# Patient Record
Sex: Male | Born: 1970 | Race: White | Hispanic: No | Marital: Married | Smoking: Never smoker
Health system: Southern US, Community
[De-identification: ages and names within clinical notes are randomized; demographics above are authoritative.]

## PROBLEM LIST (undated history)

## (undated) HISTORY — PX: LEG SURGERY: SHX1003

---

## 2019-07-07 ENCOUNTER — Other Ambulatory Visit: Payer: Self-pay

## 2019-07-07 ENCOUNTER — Ambulatory Visit
Admission: EM | Admit: 2019-07-07 | Discharge: 2019-07-07 | Disposition: A | Discharge status: Home / Self Care | Payer: BC Managed Care – PPO | Attending: Emergency Medicine | Admitting: Emergency Medicine

## 2019-07-07 DIAGNOSIS — T23122A Burn of first degree of single left finger (nail) except thumb, initial encounter: Secondary | ICD-10-CM | POA: Diagnosis not present

## 2019-07-07 MED ORDER — CEPHALEXIN 250 MG PO CAPS: 250.0000 mg | ORAL_CAPSULE | Freq: Four times a day (QID) | ORAL | 0 refills | Status: DC

## 2019-07-07 NOTE — Discharge Instructions (Signed)
Advised patient to take antibiotic as prescribed and to completion Take OTC Tylenol ibuprofen for pain management Follow-up with primary care To return for worsening of symptoms

## 2019-07-07 NOTE — ED Triage Notes (Signed)
Patient has burn to finger from hot metal last week. burn to the left index finger. Fluid drained from the finger last night. Some throbbing and numbness to finger. Tip of left index finger is slightly bluish in color

## 2019-07-07 NOTE — ED Provider Notes (Signed)
[facility]    CSN: [id number] Arrival date & time: 07/07/19  1653      History   Chief Complaint Chief Complaint  Patient presents with  . Finger Injury  . Burn    HPI Alvin Craig is a 49 y.o. male.   Alvin Craig 49 years old male presented to the urgent care with a complaint of burn to the left index finger for the past week.  Reported drainage last night.  Report a precipitating event, or specific injury.  Reports his left finger index was burned while in contact with a hot metal at work.  Developed redness and soreness couple days after.  Patient localizes pain to the left index finger.  Describes it as constant and achy in character.  Has tried OTC medications without relief.  Denies similar symptoms in the past.  Denies fever, chills, appetite change, weight change, chest pain, nausea, vomiting, changes in bowel or bladder habits.  The history is provided by the patient. No language interpreter was used.  Burn   History reviewed. No pertinent past medical history.  There are no problems to display for this patient.   History reviewed. No pertinent surgical history.     Home Medications    Prior to Admission medications   Medication Sig Start Date End Date Taking? Authorizing Provider  cephALEXin (KEFLEX) 250 MG capsule Take 1 capsule (250 mg total) by mouth 4 (four) times daily. 07/07/19   AvegnoDarrelyn Hillock, FNP    Family History Family History  Problem Relation Age of Onset  . Healthy Mother   . Healthy Father     Social History Social History   Tobacco Use  . Smoking status: Never Smoker  . Smokeless tobacco: Never Used  Substance Use Topics  . Alcohol use: Yes    Comment: 6 beers one day a week  . Drug use: Not on file     Allergies   Patient has no known allergies.   Review of Systems Review of Systems  Constitutional: Negative.   Respiratory: Negative.   Cardiovascular: Negative.   Skin: Positive for color change.  All  other systems reviewed and are negative.    Physical Exam Triage Vital Signs ED Triage Vitals  Enc Vitals Group     BP 07/07/19 1703 (!) 147/85     Pulse Rate 07/07/19 1703 85     Resp 07/07/19 1703 17     Temp 07/07/19 1703 98.3 F (36.8 C)     Temp Source 07/07/19 1703 Oral     SpO2 07/07/19 1703 97 %     Weight --      Height --      Head Circumference --      Peak Flow --      Pain Score 07/07/19 1709 3     Pain Loc --      Pain Edu? --      Excl. in [place]? --    No data found.  Updated Vital Signs BP (!) 147/85 (BP Location: Right Arm)   Pulse 85   Temp 98.3 F (36.8 C) (Oral)   Resp 17   SpO2 97%   Visual Acuity Right Eye Distance:   Left Eye Distance:   Bilateral Distance:    Right Eye Near:   Left Eye Near:    Bilateral Near:     Physical Exam Vitals and nursing note reviewed.  Constitutional:      General: He is not in acute  distress.    Appearance: Normal appearance. He is normal weight. He is not ill-appearing or toxic-appearing.  Cardiovascular:     Rate and Rhythm: Normal rate and regular rhythm.     Pulses: Normal pulses.     Heart sounds: Normal heart sounds. No murmur.  Pulmonary:     Effort: Pulmonary effort is normal. No respiratory distress.     Breath sounds: Normal breath sounds. No wheezing.  Chest:     Chest wall: No tenderness.  Skin:    General: Skin is warm.     Capillary Refill: Capillary refill takes less than 2 seconds.     Coloration: Skin is not jaundiced or pale.     Findings: Erythema present.     Comments: Skin burn  Neurological:     General: No focal deficit present.     Mental Status: He is alert and oriented to person, place, and time.     Sensory: No sensory deficit.   07/07/2019     UC Treatments / Results  Labs (all labs ordered are listed, but only abnormal results are displayed) Labs Reviewed - No data to display  EKG   Radiology No results found.  Procedures Procedures (including critical  care time)  Medications Ordered in UC Medications - No data to display  Initial Impression / Assessment and Plan / UC Course  I have reviewed the triage vital signs and the nursing notes.  Pertinent labs & imaging results that were available during my care of the patient were reviewed by me and considered in my medical decision making (see chart for details).    Will treat patient for possible infection of the left index finger Keflex was prescribed To return for worsening of symptoms  Final Clinical Impressions(s) / UC Diagnoses   Final diagnoses:  Superficial burn of index finger of left hand     Discharge Instructions     Advised patient to take antibiotic as prescribed and to completion Take OTC Tylenol ibuprofen for pain management Follow-up with primary care To return for worsening of symptoms    ED Prescriptions    Medication Sig Dispense Auth. Provider   cephALEXin (KEFLEX) 250 MG capsule Take 1 capsule (250 mg total) by mouth 4 (four) times daily. 28 capsule Lamona Eimer, Zachery Dakins, FNP     PDMP not reviewed this encounter.   Durward Parcel, FNP 07/07/19 1744

## 2019-07-08 ENCOUNTER — Encounter (HOSPITAL_COMMUNITY): Payer: Self-pay

## 2019-07-08 ENCOUNTER — Other Ambulatory Visit: Payer: Self-pay

## 2019-07-08 ENCOUNTER — Emergency Department (HOSPITAL_COMMUNITY)
Admission: EM | Admit: 2019-07-08 | Discharge: 2019-07-08 | Disposition: A | Discharge status: Home / Self Care | Payer: BC Managed Care – PPO | Attending: Emergency Medicine | Admitting: Emergency Medicine

## 2019-07-08 ENCOUNTER — Ambulatory Visit
Admission: EM | Admit: 2019-07-08 | Discharge: 2019-07-08 | Disposition: A | Discharge status: Home / Self Care | Payer: BC Managed Care – PPO

## 2019-07-08 DIAGNOSIS — Z23 Encounter for immunization: Secondary | ICD-10-CM | POA: Diagnosis not present

## 2019-07-08 DIAGNOSIS — L02512 Cutaneous abscess of left hand: Secondary | ICD-10-CM

## 2019-07-08 DIAGNOSIS — M79644 Pain in right finger(s): Secondary | ICD-10-CM | POA: Diagnosis present

## 2019-07-08 MED ORDER — LIDOCAINE HCL (PF) 2 % IJ SOLN
10.0000 mL | Freq: Once | INTRAMUSCULAR | Status: AC
  Administered 2019-07-08: 22:00:00 10 mL via INTRADERMAL

## 2019-07-08 MED ORDER — POVIDONE-IODINE 10 % EX SOLN
CUTANEOUS | Status: DC | PRN
  Administered 2019-07-08: 1 via TOPICAL
  Filled 2019-07-08: qty 15

## 2019-07-08 MED ORDER — LIDOCAINE HCL (PF) 2 % IJ SOLN
INTRAMUSCULAR | Status: AC
  Filled 2019-07-08: qty 20

## 2019-07-08 MED ORDER — LIDOCAINE HCL (PF) 2 % IJ SOLN: 5.0000 mL | Freq: Once | INTRAMUSCULAR | Status: DC

## 2019-07-08 MED ORDER — TETANUS-DIPHTH-ACELL PERTUSSIS 5-2.5-18.5 LF-MCG/0.5 IM SUSP
0.5000 mL | Freq: Once | INTRAMUSCULAR | Status: AC
  Administered 2019-07-08: 0.5 mL via INTRAMUSCULAR
  Filled 2019-07-08: qty 0.5

## 2019-07-08 NOTE — ED Provider Notes (Signed)
[facility] EMERGENCY DEPARTMENT Provider Note   CSN: [id number] Arrival date & time: 07/08/19  1831     History Chief Complaint  Patient presents with  . Finger Injury    Alvin Craig is a 49 y.o. male.  HPI      Alvin Craig is a 49 y.o. male who presents to the Emergency Department complaining of pain, discoloration, and swelling of his left index finger.  He states that he was using a heat gun 4 days ago when he accidentally burned the tip of his index finger while attempting to melt tar.  He was wearing work gloves at the time.  He reports noticing a small white appearing blister to the fat pad of the index finger.  The area later turned bluish to black in color and he reports increasing swelling and pain of his finger.  He was seen at a local urgent care yesterday and treated with cephalexin.  Today, he reports his finger has increased in size down to the level of his knuckle and the area of discoloration of the fat pad has increased.  He reports pain with movement of the finger at the distal joint.  He denies numbness of the finger, open wound or attempted drainage of the area.  No fever no chills.  Last Td is unknown   History reviewed. No pertinent past medical history.  There are no problems to display for this patient.   History reviewed. No pertinent surgical history.     Family History  Problem Relation Age of Onset  . Healthy Mother   . Healthy Father     Social History   Tobacco Use  . Smoking status: Never Smoker  . Smokeless tobacco: Never Used  Substance Use Topics  . Alcohol use: Yes    Comment: 6 beers one day a week  . Drug use: Not on file    Home Medications Prior to Admission medications   Medication Sig Start Date End Date Taking? Authorizing Provider  cephALEXin (KEFLEX) 250 MG capsule Take 1 capsule (250 mg total) by mouth 4 (four) times daily. 07/07/19   Durward Parcel, FNP    Allergies    Patient has no known  allergies.  Review of Systems   Review of Systems  Constitutional: Negative for chills and fever.  Musculoskeletal: Positive for arthralgias. Negative for joint swelling.  Skin: Positive for color change. Negative for wound.       Burn to the distal left index finger  Neurological: Negative for numbness.    Physical Exam Updated Vital Signs BP (!) 161/82 (BP Location: Right Arm)   Pulse 96   Temp 98.8 F (37.1 C) (Oral)   Resp 18   SpO2 98%   Physical Exam Vitals and nursing note reviewed.  Constitutional:      General: He is not in acute distress.    Appearance: Normal appearance. He is well-developed. He is not toxic-appearing.  HENT:     Head: Normocephalic.  Cardiovascular:     Rate and Rhythm: Normal rate and regular rhythm.     Pulses: Normal pulses.  Pulmonary:     Effort: Pulmonary effort is normal. No respiratory distress.     Breath sounds: Normal breath sounds.  Musculoskeletal:        General: Swelling, tenderness and signs of injury present.     Comments: Patient with edema and purple discoloration of the palmar surface to the distal end of the left index finger.  Palmar surface  of the finger is fluctuant.  Nail intact.  Edema extends to the PIP joint.  Wrist is nontender.  Patient is able to flex at the PIP joint but unable to flex at the DIP joint due to edema.  Sensation intact.  See photos  Skin:    General: Skin is warm.     Capillary Refill: Capillary refill takes less than 2 seconds.     Findings: No laceration.  Neurological:     Mental Status: He is alert and oriented to person, place, and time.     Sensory: No sensory deficit.     Motor: No weakness or abnormal muscle tone.            ED Results / Procedures / Treatments   Labs (all labs ordered are listed, but only abnormal results are displayed) Labs Reviewed - No data to display  EKG None  Radiology No results found.  Procedures .Marland KitchenIncision and Drainage  Date/Time: 07/08/2019  10:50 PM Performed by: Kem Parkinson, PA-C Authorized by: Kem Parkinson, PA-C   Consent:    Consent obtained:  Verbal   Consent given by:  Patient   Risks discussed:  Bleeding and incomplete drainage Location:    Type:  Abscess (burn to the finger)   Location:  Upper extremity   Upper extremity location:  Finger   Finger location:  L index finger Pre-procedure details:    Skin preparation:  Betadine Anesthesia (see MAR for exact dosages):    Anesthesia method:  Nerve block   Block location:  Left index finger   Block needle gauge:  25 G   Block anesthetic:  Lidocaine 2% w/o epi   Block technique:  Digital block   Block injection procedure:  Anatomic landmarks palpated   Block outcome:  Anesthesia achieved Procedure type:    Complexity:  Simple Procedure details:    Needle aspiration: no     Incision types:  Stab incision   Incision depth:  Dermal   Scalpel blade:  11   Wound management:  Irrigated with saline   Drainage:  Bloody and purulent   Drainage amount:  Copious   Wound treatment:  Wound left open   Packing materials:  None Post-procedure details:    Patient tolerance of procedure:  Tolerated well, no immediate complications Comments:     Abscess culture obtained   (including critical care time)    Medications Ordered in ED Medications - No data to display  ED Course  I have reviewed the triage vital signs and the nursing notes.  Pertinent labs & imaging results that were available during my care of the patient were reviewed by me and considered in my medical decision making (see chart for details).    MDM Rules/Calculators/A&P                     67 days old burn to the distal left index finger with likely abscess.   NV intact. Pt well appearing. No lymphangitis.   Consulted hand, Dr. Lenon Curt and discussed findings.  He recommends I&D, warm soaks and 24-48 hr recheck.    Successful I&D with copious bloody to purulent drainage.  Pain improved.   Thoroughly irrigated with saline.  Pt agrees to continue his abx, abscess cultured, td updated    Final Clinical Impression(s) / ED Diagnoses Final diagnoses:  Abscess of left index finger    Rx / DC Orders ED Discharge Orders    None  Pauline Aus, PA-C 07/09/19 Mike Gip    Bethann Berkshire, MD 07/11/19 [phone]

## 2019-07-08 NOTE — ED Triage Notes (Signed)
Pt presents to ED with complaints of burn to left index finger. Pt was seen in UC yesterday and given abx. Pt states swelling is getting bigger and UC sent to ED today.

## 2019-07-08 NOTE — ED Notes (Signed)
ED Provider at bedside. 

## 2019-07-08 NOTE — Discharge Instructions (Addendum)
Elevate your hand as much as possible.  Soak your finger in warm water mixed with peroxide.  Keep the finger bandaged.  Return here in 24 hours for recheck.  Continue taking your antibiotics as directed.  Over-the-counter ibuprofen 600 mg 3 times a day with food as needed for pain.

## 2019-07-10 ENCOUNTER — Encounter (HOSPITAL_COMMUNITY): Payer: Self-pay | Admitting: *Deleted

## 2019-07-10 ENCOUNTER — Other Ambulatory Visit: Payer: Self-pay

## 2019-07-10 ENCOUNTER — Emergency Department (HOSPITAL_COMMUNITY)
Admission: EM | Admit: 2019-07-10 | Discharge: 2019-07-10 | Disposition: A | Discharge status: Home / Self Care | Payer: BC Managed Care – PPO | Attending: Emergency Medicine | Admitting: Emergency Medicine

## 2019-07-10 DIAGNOSIS — L02512 Cutaneous abscess of left hand: Secondary | ICD-10-CM | POA: Diagnosis not present

## 2019-07-10 DIAGNOSIS — Z4800 Encounter for change or removal of nonsurgical wound dressing: Secondary | ICD-10-CM | POA: Diagnosis present

## 2019-07-10 DIAGNOSIS — Z5189 Encounter for other specified aftercare: Secondary | ICD-10-CM | POA: Diagnosis not present

## 2019-07-10 MED ORDER — SULFAMETHOXAZOLE-TRIMETHOPRIM 800-160 MG PO TABS: 1.0000 | ORAL_TABLET | Freq: Two times a day (BID) | ORAL | 0 refills | Status: AC

## 2019-07-10 NOTE — ED Notes (Signed)
Here for recheck for index finger burn last week   Wound is healing and is assessed and dressed

## 2019-07-10 NOTE — Discharge Instructions (Addendum)
Continue to warm water soaks to finger.  Keep it bandaged while working.  Avoid excessive use of your left index finger.  Contact the hand surgeon listed to arrange a follow-up appointment next week if your symptoms are not continuing to improve.

## 2019-07-10 NOTE — ED Provider Notes (Signed)
[facility] EMERGENCY DEPARTMENT Provider Note   CSN: [id number] Arrival date & time: 07/10/19  1605     History Chief Complaint  Patient presents with  . Wound Check    Alvin Craig is a 49 y.o. male.  HPI      Alvin Craig is a 49 y.o. male who presents to the Emergency Department requesting reevaluation of his left index finger.  He was initially seen at a local urgent care and came to ER after he developed increasing pain and swelling to the finger.  He was seen here by me on 07/08/19 and found to have discoloration and swelling of his distal left index finger secondary to a heat burn.  I performed an I&D with a significant amount of bloody, purulent material drained from the finger.  Patient is here today at my request for recheck.  He is taking keflex 4 times per day.  He reports swelling and pain of the finger is improving and he is now able to bend his finger more easily.  He denies numbness, increasing pain or redness, fever or chills.     History reviewed. No pertinent past medical history.  There are no problems to display for this patient.   History reviewed. No pertinent surgical history.     Family History  Problem Relation Age of Onset  . Healthy Mother   . Healthy Father     Social History   Tobacco Use  . Smoking status: Never Smoker  . Smokeless tobacco: Never Used  Substance Use Topics  . Alcohol use: Yes    Comment: 6 beers one day a week  . Drug use: Not on file    Home Medications Prior to Admission medications   Medication Sig Start Date End Date Taking? Authorizing Provider  cephALEXin (KEFLEX) 250 MG capsule Take 1 capsule (250 mg total) by mouth 4 (four) times daily. 07/07/19   Avegno, Darrelyn Hillock, FNP  ibuprofen (ADVIL) 200 MG tablet Take 200 mg by mouth every 6 (six) hours as needed for mild pain or moderate pain.    [provider]  sulfamethoxazole-trimethoprim (BACTRIM DS) 800-160 MG tablet Take 1 tablet by mouth 2 (two) times  daily for 7 days. 07/10/19 07/17/19  Ikenna Ohms, Lynelle Smoke, PA-C    Allergies    Patient has no known allergies.  Review of Systems   Review of Systems  Constitutional: Negative for chills and fever.  Gastrointestinal: Negative for nausea and vomiting.  Musculoskeletal:       Previous I&D of the left index finger.    Skin: Negative for color change.  Hematological: Negative for adenopathy.    Physical Exam Updated Vital Signs BP 133/72 (BP Location: Right Arm)   Pulse 85   Temp 97.8 F (36.6 C) (Temporal)   Resp 12   Ht 5\' 8"  (1.727 m)   Wt 97.5 kg   SpO2 100%   BMI 32.69 kg/m   Physical Exam Vitals and nursing note reviewed.  Constitutional:      General: He is not in acute distress.    Appearance: Normal appearance.  Cardiovascular:     Rate and Rhythm: Normal rate and regular rhythm.     Pulses: Normal pulses.  Pulmonary:     Effort: Pulmonary effort is normal.     Breath sounds: Normal breath sounds.  Musculoskeletal:     Comments: Previous I&D of the palmar surface of the distal left index finger.  Small amt of serous drainage noted.  Erythema improved  from previous visit.  Pt able to flex at DIP and PIP joints.    Skin:    General: Skin is warm.     Capillary Refill: Capillary refill takes less than 2 seconds.     Findings: No rash.  Neurological:     Mental Status: He is alert.     ED Results / Procedures / Treatments   Labs (all labs ordered are listed, but only abnormal results are displayed) Labs Reviewed - No data to display  EKG None  Radiology No results found.  Procedures Procedures (including critical care time)  Medications Ordered in ED Medications - No data to display  ED Course  I have reviewed the triage vital signs and the nursing notes.  Pertinent labs & imaging results that were available during my care of the patient were reviewed by me and considered in my medical decision making (see chart for details).    MDM  Rules/Calculators/A&P                      Patient seen here by me 2 days ago and had I&D of the distal finger.  Area appears to be improving.  Erythema is much improved from previous visit and patient has improved movement of the PIP and DIP joints of the finger.  Sensation intact.  He will continue his Keflex and I have also prescribed Bactrim.  He will continue warm water soaks and he was given referral information for hand specialty.   Final Clinical Impression(s) / ED Diagnoses Final diagnoses:  Wound check, abscess    Rx / DC Orders ED Discharge Orders         Ordered    sulfamethoxazole-trimethoprim (BACTRIM DS) 800-160 MG tablet  2 times daily     07/10/19 1653           Pauline Aus, PA-C 07/10/19 1802    Cathren Laine, MD 07/10/19 1842

## 2019-07-10 NOTE — ED Triage Notes (Signed)
Pt instructed to be seen again for wound to left index finger.  Pt states it is better and not getting worse. Pt states he is taking antibiotics as directed.

## 2019-07-11 LAB — AEROBIC CULTURE W GRAM STAIN (SUPERFICIAL SPECIMEN): Gram Stain: NONE SEEN

## 2019-07-12 ENCOUNTER — Telehealth: Payer: Self-pay

## 2019-07-12 NOTE — Telephone Encounter (Signed)
Post ED Visit - Positive Culture Follow-up  Culture report reviewed by antimicrobial stewardship pharmacist: [facility] []  , Pharm.D. []  Enzo Bi, Pharm.D., BCPS AQ-ID []  , Pharm.D., BCPS []  Celedonio Miyamoto, Pharm.D., BCPS []  [place], Garvin Fila.D., BCPS, AAHIVP []  , Pharm.D., BCPS, AAHIVP [x]  Georgina Pillion, PharmD, BCPS []  , PharmD, BCPS []  [place], PharmD, BCPS []  [address], PharmD []  , PharmD, BCPS []  Estella Husk, PharmD  Pharmacy Team []  Lysle Pearl, PharmD []  , PharmD []  Phillips Climes, PharmD []  , Rph []  Agapito Games) , PharmD []  Verlan Friends, PharmD []  , PharmD []  Mervyn Gay, PharmD []  , PharmD []  Vinnie Level, PharmD []  [facility], PharmD []  , PharmD []  Len Childs, PharmD   Positive aerobic culture Treated with Keflex, BactrimDS, organism sensitive to the same and no further patient follow-up is required at this time.  07/12/2019, 9:26 AM

## 2019-07-21 ENCOUNTER — Telehealth: Payer: Self-pay

## 2019-07-21 ENCOUNTER — Encounter: Payer: Self-pay | Admitting: *Deleted

## 2019-07-21 NOTE — Telephone Encounter (Signed)
Received a call from [facility]. A ref was sent over and Alvin Craig wanted to get pt scheduled. [phone]

## 2019-07-22 NOTE — Telephone Encounter (Signed)
Pt is scheduled a telephone NV for 10/04/2019 at 3pm with triage nurse and a letter was mailed to the patient yesterday.

## 2019-10-04 ENCOUNTER — Other Ambulatory Visit: Payer: Self-pay

## 2019-10-04 ENCOUNTER — Ambulatory Visit: Payer: Self-pay

## 2020-01-04 ENCOUNTER — Other Ambulatory Visit: Payer: Self-pay

## 2020-01-04 ENCOUNTER — Encounter: Payer: Self-pay | Admitting: Internal Medicine

## 2020-01-04 ENCOUNTER — Ambulatory Visit: Payer: Self-pay

## 2020-01-05 ENCOUNTER — Ambulatory Visit: Payer: BC Managed Care – PPO | Attending: Internal Medicine

## 2020-01-05 DIAGNOSIS — Z23 Encounter for immunization: Secondary | ICD-10-CM

## 2020-01-05 NOTE — Progress Notes (Signed)
   Covid-19 Vaccination Clinic  Name:  Alvin Craig    MRN: [id number] DOB: March 03, 1971  01/05/2020  Mr. Alvin Craig was observed post Covid-19 immunization for 15 minutes without incident. He was provided with Vaccine Information Sheet and instruction to access the V-Safe system.   Mr. Alvin Craig was instructed to call 911 with any severe reactions post vaccine: Marland Kitchen Difficulty breathing  . Swelling of face and throat  . A fast heartbeat  . A bad rash all over body  . Dizziness and weakness   Immunizations Administered    Name Date Dose VIS Date Route   Pfizer COVID-19 Vaccine 01/05/2020  9:57 AM 0.3 mL 08/10/2018 Intramuscular   Manufacturer: [employer], [employer]   Lot: [id number]   NDC: [address]-9

## 2020-01-26 ENCOUNTER — Ambulatory Visit: Payer: BC Managed Care – PPO | Attending: Internal Medicine

## 2020-01-26 DIAGNOSIS — Z23 Encounter for immunization: Secondary | ICD-10-CM

## 2020-01-26 NOTE — Progress Notes (Signed)
° °  Covid-19 Vaccination Clinic  Name:  Alvin Craig    MRN: [id number] DOB: Feb 28, 1971  01/26/2020  Mr. Alvin Craig was observed post Covid-19 immunization for 15 minutes without incident. He was provided with Vaccine Information Sheet and instruction to access the V-Safe system.   Mr. Alvin Craig was instructed to call 911 with any severe reactions post vaccine:  Difficulty breathing   Swelling of face and throat   A fast heartbeat   A bad rash all over body   Dizziness and weakness   Immunizations Administered    Name Date Dose VIS Date Route   Pfizer COVID-19 Vaccine 01/26/2020  9:53 AM 0.3 mL 08/10/2018 Intramuscular   Manufacturer: [employer], [employer]   Lot: [id number]   NDC: [address]-2

## 2020-08-29 ENCOUNTER — Encounter (HOSPITAL_COMMUNITY): Payer: Self-pay

## 2020-08-29 ENCOUNTER — Other Ambulatory Visit: Payer: Self-pay

## 2020-08-29 ENCOUNTER — Emergency Department (HOSPITAL_COMMUNITY): Payer: BC Managed Care – PPO

## 2020-08-29 ENCOUNTER — Emergency Department (HOSPITAL_COMMUNITY)
Admission: EM | Admit: 2020-08-29 | Discharge: 2020-08-29 | Disposition: A | Discharge status: Home / Self Care | Payer: BC Managed Care – PPO | Attending: Emergency Medicine | Admitting: Emergency Medicine

## 2020-08-29 DIAGNOSIS — F17220 Nicotine dependence, chewing tobacco, uncomplicated: Secondary | ICD-10-CM | POA: Insufficient documentation

## 2020-08-29 DIAGNOSIS — R0789 Other chest pain: Secondary | ICD-10-CM | POA: Insufficient documentation

## 2020-08-29 LAB — CBC
HCT: 43.6 % (ref 39.0–52.0)
Hemoglobin: 14.1 g/dL (ref 13.0–17.0)
MCH: 29.3 pg (ref 26.0–34.0)
MCHC: 32.3 g/dL (ref 30.0–36.0)
MCV: 90.5 fL (ref 80.0–100.0)
Platelets: 242 10*3/uL (ref 150–400)
RBC: 4.82 MIL/uL (ref 4.22–5.81)
RDW: 13.1 % (ref 11.5–15.5)
WBC: 6.6 10*3/uL (ref 4.0–10.5)
nRBC: 0 % (ref 0.0–0.2)

## 2020-08-29 LAB — BASIC METABOLIC PANEL
Anion gap: 10 (ref 5–15)
BUN: 20 mg/dL (ref 6–20)
CO2: 27 mmol/L (ref 22–32)
Calcium: 9.1 mg/dL (ref 8.9–10.3)
Chloride: 106 mmol/L (ref 98–111)
Creatinine, Ser: 0.8 mg/dL (ref 0.61–1.24)
GFR, Estimated: >60 mL/min (ref >60)
Glucose, Bld: 135 mg/dL — ABNORMAL HIGH (ref 70–99)
Potassium: 4 mmol/L (ref 3.5–5.1)
Sodium: 143 mmol/L (ref 135–145)

## 2020-08-29 LAB — TROPONIN I (HIGH SENSITIVITY): Troponin I (High Sensitivity): <2 ng/L (ref <18)

## 2020-08-29 NOTE — Discharge Instructions (Addendum)
I suspect your chest pain is secondary due to a muscular strain.  Will recommend over-the-counter pain medications like ibuprofen and/or Tylenol every 6 hours as needed please follow dosage on the back of bottle.  Like you to follow-up with your primary care provider for reevaluation.  Come back to the emergency department if you develop chest pain, shortness of breath, severe abdominal pain, uncontrolled nausea, vomiting, diarrhea.

## 2020-08-29 NOTE — ED Triage Notes (Signed)
Pt to er, pt states that he had a sudden onset of chest pain two days ago, states that it is a sporadic chest pain that seems to come and go, pt states that the pain wraps around his flank, denies shortness of breath.  States that he has some pressure when he takes a deep breath. Pt talking in full sentences.

## 2020-08-29 NOTE — ED Notes (Signed)
Pt ambulated in the hallway and oxygen saturations never fell below 97 percent.

## 2020-08-29 NOTE — ED Provider Notes (Signed)
[facility] EMERGENCY DEPARTMENT Provider Note   CSN: [id number] Arrival date & time: 08/29/20  1351     History Chief Complaint  Patient presents with  . Chest Pain    Alvin Craig is a 50 y.o. male.  HPI   Patient with no significant medical history presents to the emergency department with chief complaint of intermittent chest pain.  He endorses this started approximately 2 days ago, states he will have a sharp-like sensation on the left side of his chest will last a few seconds and go away on its own, denies exertion or breathing makes the pain worse, pain is not associated with shortness of breath, nausea, vomiting, lightheadedness or dizziness.  Patient denies smoking history, does not drink alcohol, denies illicit drug use, he has no cardiac history, no history of PEs or DVTs, currently not on hormone therapy.  Patient denies  alleviating or aggravating factors.  Patient denies headaches, fevers, chills, shortness of breath, abdominal pain, nausea, vomiting, diarrhea, worsening pedal edema.  History reviewed. No pertinent past medical history.  There are no problems to display for this patient.   Past Surgical History:  Procedure Laterality Date  . LEG SURGERY         Family History  Problem Relation Age of Onset  . Healthy Mother   . Healthy Father     Social History   Tobacco Use  . Smoking status: Never Smoker  . Smokeless tobacco: Current User    Types: Chew  Vaping Use  . Vaping Use: Never used  Substance Use Topics  . Alcohol use: Yes    Comment: 6 beers one day a week  . Drug use: Never    Home Medications Prior to Admission medications   Medication Sig Start Date End Date Taking? Authorizing Provider  cephALEXin (KEFLEX) 250 MG capsule Take 1 capsule (250 mg total) by mouth 4 (four) times daily. 07/07/19   Avegno, Zachery Dakins, FNP  ibuprofen (ADVIL) 200 MG tablet Take 200 mg by mouth every 6 (six) hours as needed for mild pain or moderate pain.     [provider]    Allergies    Patient has no known allergies.  Review of Systems   Review of Systems  Constitutional: Negative for chills and fever.  HENT: Negative for congestion.   Respiratory: Positive for chest tightness. Negative for shortness of breath.   Cardiovascular: Positive for chest pain.  Gastrointestinal: Negative for abdominal pain.  Genitourinary: Negative for enuresis.  Musculoskeletal: Negative for back pain.  Skin: Negative for rash.  Neurological: Negative for headaches.  Hematological: Does not bruise/bleed easily.    Physical Exam Updated Vital Signs BP (!) 146/79   Pulse 64   Temp 98 F (36.7 C) (Oral)   Resp 16   Ht 5\' 8"  (1.727 m)   Wt 90.7 kg   SpO2 100%   BMI 30.41 kg/m   Physical Exam Vitals and nursing note reviewed.  Constitutional:      General: He is not in acute distress.    Appearance: He is not ill-appearing.  HENT:     Head: Normocephalic and atraumatic.     Nose: No congestion.  Eyes:     Conjunctiva/sclera: Conjunctivae normal.  Cardiovascular:     Rate and Rhythm: Normal rate and regular rhythm.     Pulses: Normal pulses.     Heart sounds: No murmur heard. No friction rub. No gallop.      Comments: Patient currently has no chest  pain at this time, could not reproduce pain upon palpation. Pulmonary:     Effort: No respiratory distress.     Breath sounds: No wheezing, rhonchi or rales.  Abdominal:     Palpations: Abdomen is soft.     Tenderness: There is no abdominal tenderness. There is no right CVA tenderness or left CVA tenderness.  Musculoskeletal:     Right lower leg: No edema.     Left lower leg: No edema.  Skin:    General: Skin is warm and dry.  Neurological:     Mental Status: He is alert.  Psychiatric:        Mood and Affect: Mood normal.     ED Results / Procedures / Treatments   Labs (all labs ordered are listed, but only abnormal results are displayed) Labs Reviewed  BASIC METABOLIC  PANEL - Abnormal; Notable for the following components:      Result Value   Glucose, Bld 135 (*)    All other components within normal limits  CBC  TROPONIN I (HIGH SENSITIVITY)    EKG None  Radiology DG Chest 2 View  Result Date: 08/29/2020 CLINICAL DATA:  Chest pain. EXAM: CHEST - 2 VIEW COMPARISON:  None. FINDINGS: The heart size and mediastinal contours are within normal limits. Both lungs are clear. No pneumothorax or pleural effusion is noted. The visualized skeletal structures are unremarkable. IMPRESSION: No active cardiopulmonary disease. Electronically Signed   By: Lupita Raider M.D.   On: 08/29/2020 14:42    Procedures Procedures   Medications Ordered in ED Medications - No data to display  ED Course  I have reviewed the triage vital signs and the nursing notes.  Pertinent labs & imaging results that were available during my care of the patient were reviewed by me and considered in my medical decision making (see chart for details).    MDM Rules/Calculators/A&P                         Initial impression-patient presents with intermittent left-sided chest pain which is currently resolved at this time.  Patient is alert, does not appear in acute distress, vital signs reassuring.  Will obtain chest pain work-up and reassess.  Work-up-CBC unremarkable, BMP shows slight hyperglycemia 135, initial troponin is less than 2.  Chest x-ray negative for acute findings.  EKG sinus without signs of ischemia no ST elevation depression noted.  Patient is ambulated did not become hypoxic or tachycardic  Reassessment patient is reassessed, has no complaints at this time, vital signs remained stable.  Patient is agreeable for discharge at this time.  Rule out- I have low suspicion for ACS as history is atypical, patient has no cardiac history, EKG was sinus rhythm without signs of ischemia, initial troponin is less than 2, will defer second troponin as patient had chest pain for  greater than 12 hours, would expect elevation if ACS was present.  Low suspicion for PE as patient denies pleuritic chest pain, shortness of breath, patient denies leg pain, no pedal edema noted on exam, patient was PERC negative, no tachypnea or hypoxia on ambulation.  low suspicion for AAA or aortic dissection as history is atypical, patient has low risk factors.  Low suspicion for systemic infection as patient is nontoxic-appearing, vital signs reassuring, no obvious source infection noted on exam.   Plan-patient has chest pain with unknown etiology, I suspect this is musculoskeletal but differential diagnosis includes anxiety, costochondritis, cervical angina,  GERD, esophagus spasms.  Will recommend over-the-counter pain medications follow-up with PCP for further evaluation.  Vital signs have remained stable, no indication for hospital admission. Patient given at home care as well strict return precautions.  Patient verbalized that they understood agreed to said plan.   Final Clinical Impression(s) / ED Diagnoses Final diagnoses:  Atypical chest pain    Rx / DC Orders ED Discharge Orders    None       Carroll Sage, PA-C 08/29/20 1640    Vanetta Mulders, MD 09/05/20 [phone]

## 2021-11-11 IMAGING — DX DG CHEST 2V
2 series · 2 of 2 positions shown · non-contrast
Comparison: None.

CLINICAL DATA: Chest pain.

EXAM:
CHEST - 2 VIEW

[chest pa]
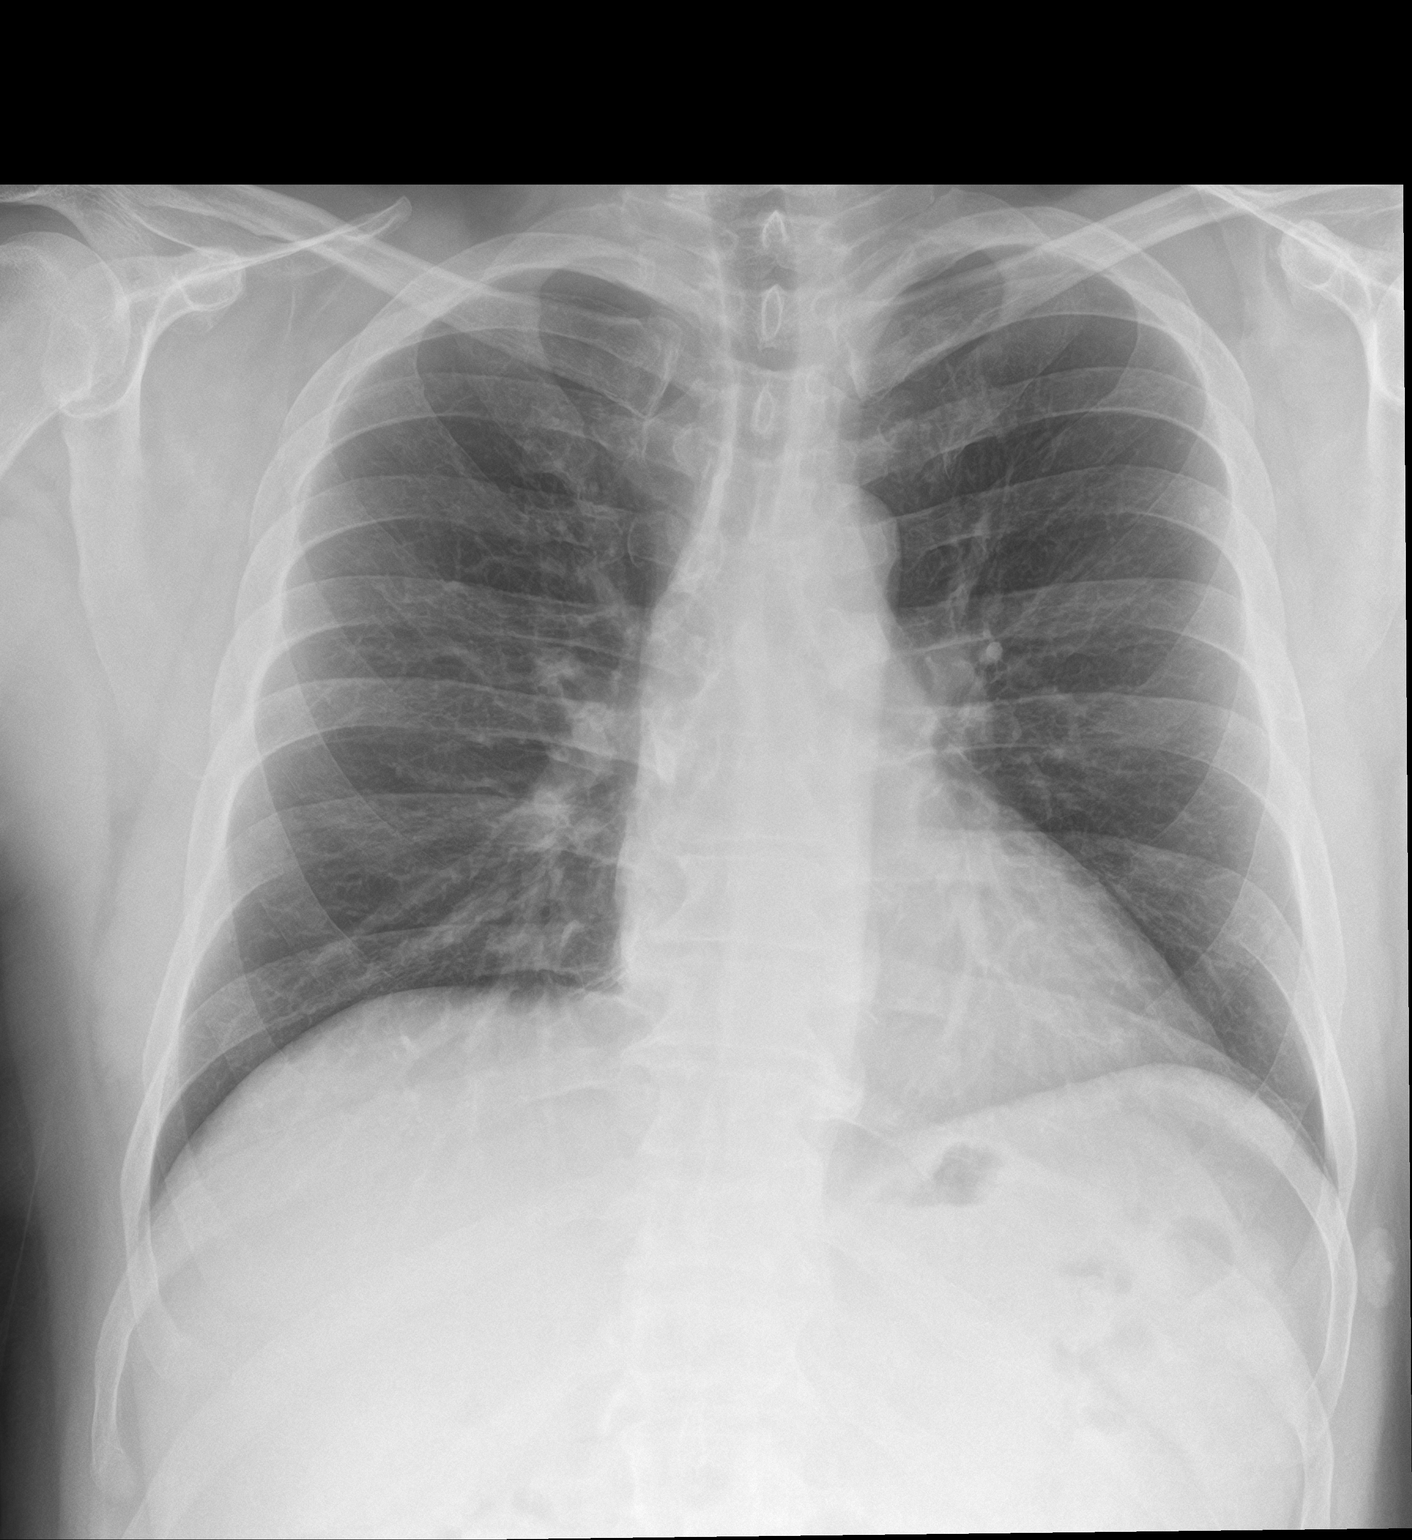

[chest lat]
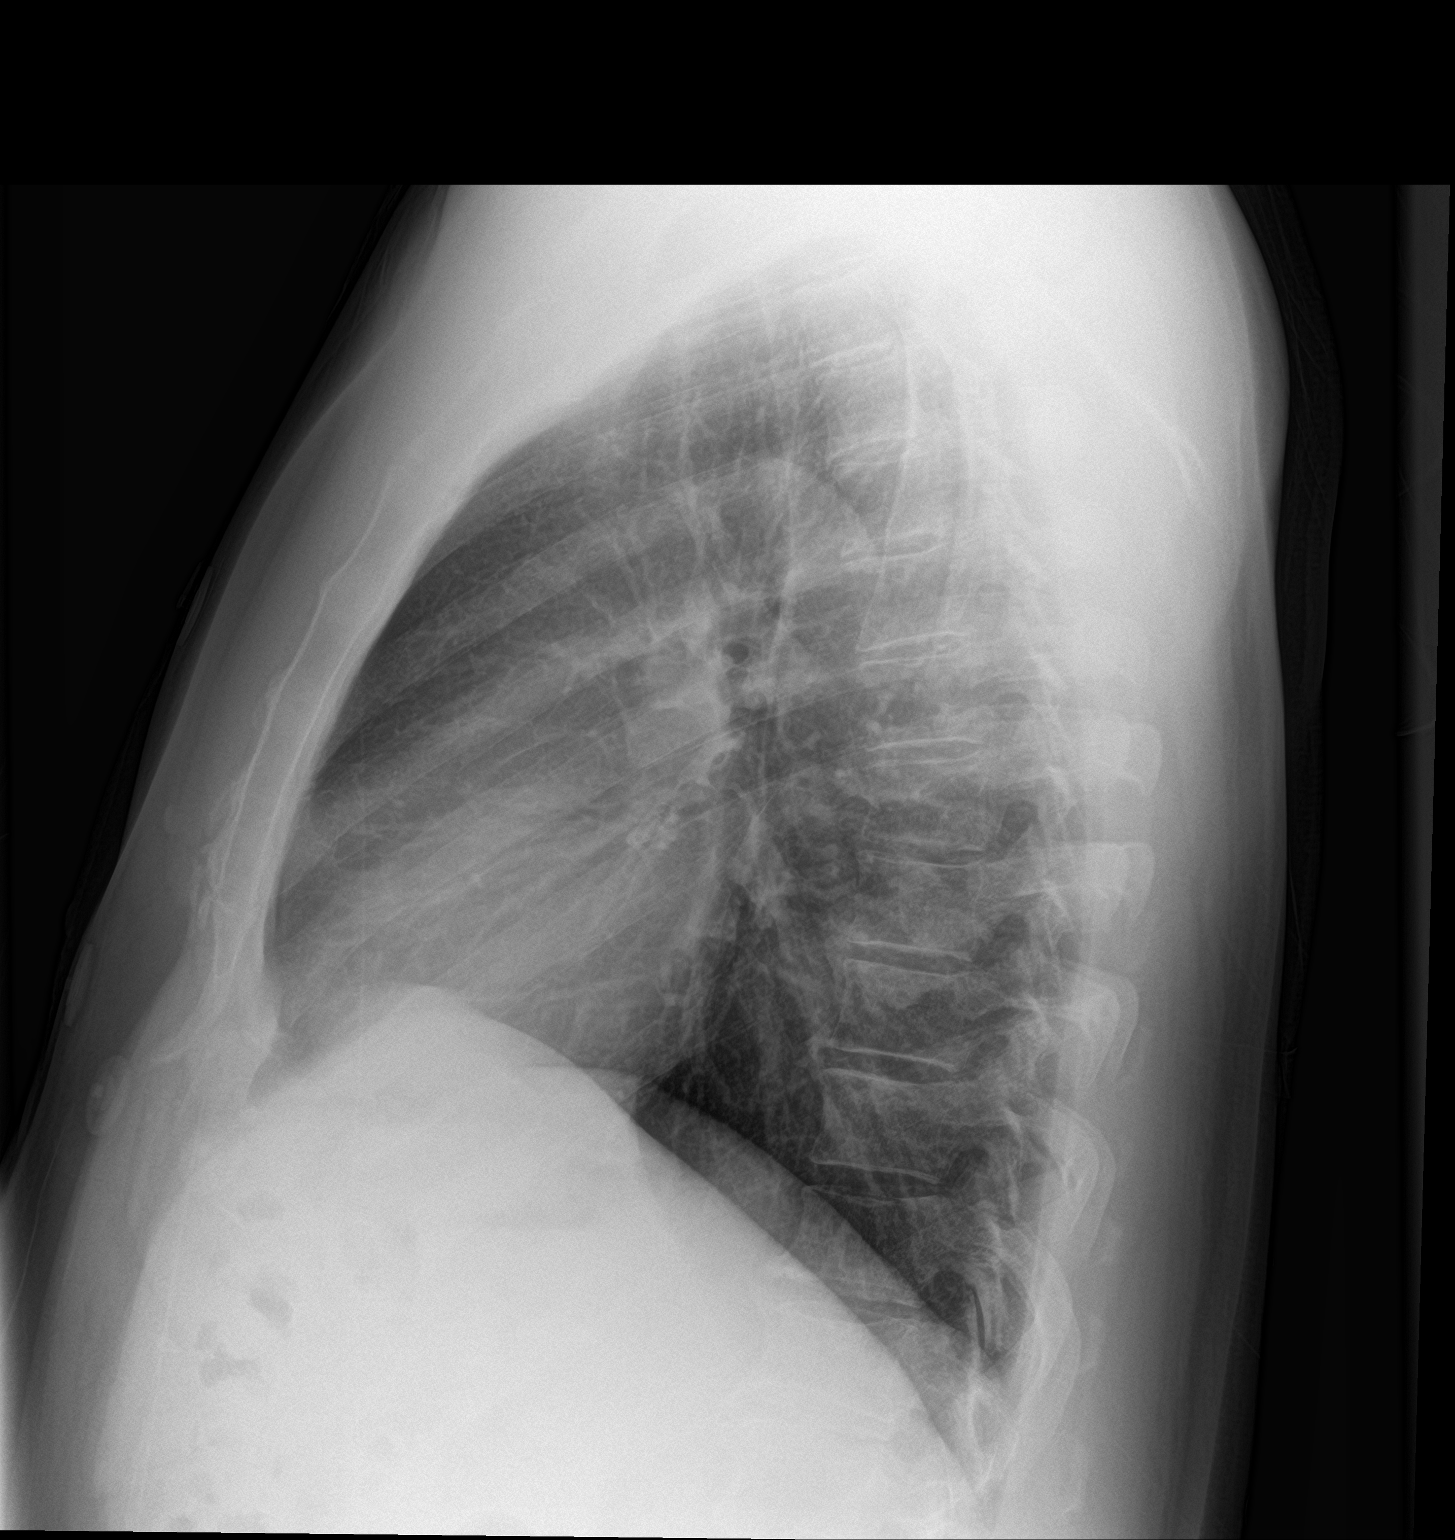

[2 of 2 positions shown; findings below may reference images not displayed]

FINDINGS: The heart size and mediastinal contours are within normal limits.
Both lungs are clear. No pneumothorax or pleural effusion is noted.
The visualized skeletal structures are unremarkable.
IMPRESSION: No active cardiopulmonary disease.

## 2022-09-26 ENCOUNTER — Ambulatory Visit: Payer: BC Managed Care – PPO | Admitting: Podiatry

## 2024-02-24 ENCOUNTER — Ambulatory Visit (INDEPENDENT_AMBULATORY_CARE_PROVIDER_SITE_OTHER)
Admission: RE | Admit: 2024-02-24 | Discharge: 2024-02-24 | Disposition: A | Discharge status: Home / Self Care | Source: Ambulatory Visit | Attending: Family Medicine | Admitting: Family Medicine

## 2024-02-24 ENCOUNTER — Ambulatory Visit (INDEPENDENT_AMBULATORY_CARE_PROVIDER_SITE_OTHER): Admitting: Family Medicine

## 2024-02-24 ENCOUNTER — Encounter: Payer: Self-pay | Admitting: Family Medicine

## 2024-02-24 VITALS — BP 158/98 | HR 82 | Temp 98.2°F | Ht 68.5 in | Wt 243.5 lb

## 2024-02-24 DIAGNOSIS — R5383 Other fatigue: Secondary | ICD-10-CM

## 2024-02-24 DIAGNOSIS — Z131 Encounter for screening for diabetes mellitus: Secondary | ICD-10-CM

## 2024-02-24 DIAGNOSIS — I10 Essential (primary) hypertension: Secondary | ICD-10-CM

## 2024-02-24 DIAGNOSIS — Z13220 Encounter for screening for lipoid disorders: Secondary | ICD-10-CM

## 2024-02-24 DIAGNOSIS — G8929 Other chronic pain: Secondary | ICD-10-CM

## 2024-02-24 DIAGNOSIS — Z125 Encounter for screening for malignant neoplasm of prostate: Secondary | ICD-10-CM

## 2024-02-24 DIAGNOSIS — M1711 Unilateral primary osteoarthritis, right knee: Secondary | ICD-10-CM

## 2024-02-24 DIAGNOSIS — M25561 Pain in right knee: Secondary | ICD-10-CM

## 2024-02-24 DIAGNOSIS — Z720 Tobacco use: Secondary | ICD-10-CM

## 2024-02-24 DIAGNOSIS — M2170 Unequal limb length (acquired), unspecified site: Secondary | ICD-10-CM

## 2024-02-24 LAB — CBC WITH DIFFERENTIAL/PLATELET
Basophils Absolute: 0 K/uL (ref 0.0–0.1)
Basophils Relative: 0.6 % (ref 0.0–3.0)
Eosinophils Absolute: 0.1 K/uL (ref 0.0–0.7)
Eosinophils Relative: 2.8 % (ref 0.0–5.0)
HCT: 40.8 % (ref 39.0–52.0)
Hemoglobin: 13.7 g/dL (ref 13.0–17.0)
Lymphocytes Relative: 23.1 % (ref 12.0–46.0)
Lymphs Abs: 1.2 K/uL (ref 0.7–4.0)
MCHC: 33.6 g/dL (ref 30.0–36.0)
MCV: 87 fl (ref 78.0–100.0)
Monocytes Absolute: 0.5 K/uL (ref 0.1–1.0)
Monocytes Relative: 8.9 % (ref 3.0–12.0)
Neutro Abs: 3.4 K/uL (ref 1.4–7.7)
Neutrophils Relative %: 64.6 % (ref 43.0–77.0)
Platelets: 234 K/uL (ref 150.0–400.0)
RBC: 4.69 Mil/uL (ref 4.22–5.81)
RDW: 14.3 % (ref 11.5–15.5)
WBC: 5.3 K/uL (ref 4.0–10.5)

## 2024-02-24 LAB — BASIC METABOLIC PANEL WITH GFR
BUN: 20 mg/dL (ref 6–23)
CO2: 28 meq/L (ref 19–32)
Calcium: 9.4 mg/dL (ref 8.4–10.5)
Chloride: 104 meq/L (ref 96–112)
Creatinine, Ser: 0.91 mg/dL (ref 0.40–1.50)
GFR: 95.62 mL/min (ref >60.00)
Glucose, Bld: 90 mg/dL (ref 70–99)
Potassium: 4.4 meq/L (ref 3.5–5.1)
Sodium: 141 meq/L (ref 135–145)

## 2024-02-24 LAB — HEPATIC FUNCTION PANEL
ALT: 27 U/L (ref 0–53)
AST: 23 U/L (ref 0–37)
Albumin: 4.6 g/dL (ref 3.5–5.2)
Alkaline Phosphatase: 60 U/L (ref 39–117)
Bilirubin, Direct: 0.1 mg/dL (ref 0.0–0.3)
Total Bilirubin: 0.6 mg/dL (ref 0.2–1.2)
Total Protein: 7.1 g/dL (ref 6.0–8.3)

## 2024-02-24 LAB — LIPID PANEL
Cholesterol: 199 mg/dL (ref 0–200)
HDL: 48.2 mg/dL (ref >39.00)
LDL Cholesterol: 109 mg/dL — ABNORMAL HIGH (ref 0–99)
NonHDL: 150.59
Total CHOL/HDL Ratio: 4
Triglycerides: 208 mg/dL — ABNORMAL HIGH (ref 0.0–149.0)
VLDL: 41.6 mg/dL — ABNORMAL HIGH (ref 0.0–40.0)

## 2024-02-24 LAB — HEMOGLOBIN A1C: Hgb A1c MFr Bld: 5.9 % (ref 4.6–6.5)

## 2024-02-24 MED ORDER — TRIAMCINOLONE ACETONIDE 40 MG/ML IJ SUSP
40.0000 mg | Freq: Once | INTRAMUSCULAR | Status: AC
  Administered 2024-02-24: 40 mg via INTRA_ARTICULAR

## 2024-02-24 MED ORDER — LOSARTAN POTASSIUM 25 MG PO TABS: 25.0000 mg | ORAL_TABLET | Freq: Every day | ORAL | 3 refills | Status: DC

## 2024-02-24 NOTE — Progress Notes (Signed)
 Alvin Wardell T. Chan Sheahan, MD, CAQ Sports Medicine [facility] at [facility] [address] [place], [address]  Phone: [phone]  FAX: [phone]  Alvin Craig - 53 y.o. male  MRN [id number]  Date of Birth: [birth year]  Date: 02/24/2024  PCP: Alvin Craig, Provider Not In  Referral: No ref. provider found  Chief Complaint  Patient presents with   New Patient (Initial Visit)    Here to establish care.    Subjective:   Alvin Craig is a 53 y.o. very pleasant male patient with Body mass index is 36.49 kg/m. who presents with the following:  Discussed the use of AI scribe software for clinical note transcription with the patient, who gave verbal consent to proceed. History of Present Illness Alvin Craig is a 53 year old male who presents with right knee pain and swelling. He is accompanied by his wife.  He has been experiencing right knee pain and swelling for approximately six to eight months, causing stiffness and limited motion, particularly in extension. He describes the knee as 'definitely stiff' and 'not normal.' There is no history of prior injury or surgeries on this knee.  He has a significant leg length discrepancy since childhood, with the affected leg being shorter by a couple of inches. This discrepancy has been present since he was about 53 years old, and he has not pursued any corrective measures in adulthood. He mentions a past surgery on his hip during childhood but does not recall the specifics.  He does not take any regular medications and has seasonal allergies. He uses dip tobacco, approximately one can per day, and consumes alcohol, about eight to ten drinks per week, which includes a mix of beer and mixed drinks. He denies marijuana use or other drug use.  He has not had any recent blood work and has not been to the doctor in a long time. He works at [employer] in [place], [place] , and has been living in [place]  for five years.  Originally from [place], he has been married for twenty-five years and has two children aged thirty-two and 30.  No recent surgeries or other medical problems aside from the knee pain and leg length discrepancy.    Review of Systems is noted in the HPI, as appropriate  Objective:   BP (!) 158/98   Pulse 82   Temp 98.2 F (36.8 C) (Oral)   Ht 5' 8.5 (1.74 m)   Wt 243 lb 8 oz (110.5 kg)   SpO2 96%   BMI 36.49 kg/m   GEN: No acute distress; alert,appropriate. CV: RRR, no m/g/r  PULM: Breathing comfortably in no respiratory distress PSYCH: Normally interactive.   Right knee: Mild to moderate effusion Full extension Flexion to 110 Stable to varus and valgus stress Medial joint line tenderness ACL, PCL, MCL, LCL are all stable No tenderness at the patellar tendon or in the region of the Pes Strength is 5/5 None at anserine or tibial plateau  Leg length discrepancy roughly 5 cm on the left shorter  Physical Exam VITALS: BP- 170/86 MUSCULOSKELETAL: Right knee swollen with limited motion, stable ligaments, ACL stable. Significant leg length discrepancy with the right leg shorter.  Laboratory and Imaging Data:  Assessment and Plan:     ICD-10-CM   1. Primary osteoarthritis of right knee  M17.11     2. Chronic pain of right knee  M25.561 DG Knee 4 Views W/Patella Right   G89.29 triamcinolone  acetonide (KENALOG -40) injection 40 mg  3. Essential hypertension  I10     4. Screening for diabetes mellitus  Z13.1 Basic metabolic panel with GFR    Hemoglobin A1c    5. Screening, lipid  Z13.220 Lipid panel    6. Other fatigue  R53.83 CBC with Differential/Platelet    Hepatic function panel    7. Screening for prostate cancer  Z12.5 PSA, Total with Reflex to PSA, Free    8. Acquired leg length discrepancy  M21.70      Assessment & Plan Right knee osteoarthritis Chronic right knee pain and stiffness with suspected significant arthritis, though not end-stage.  Symptoms may persist indefinitely. - Order right knee x-ray to assess extent of arthritis. - Administer corticosteroid injection in right knee for symptomatic relief. - Advise use of ibuprofen or Aleve for pain management. - Advise against high-impact activities post-injection, normal activities allowed.  Acquired unequal leg length Significant leg length discrepancy with right leg shorter since childhood, contributing to knee and hip issues. Insole correction not feasible. - Provide prescription for prosthetic consultation for shoe lift to correct leg length discrepancy. - Encourage consideration of shoe lift, especially for work boots, to improve function.  Essential hypertension Elevated blood pressure at 170/86 mmHg with no prior management. - Order blood work to assess for underlying conditions such as diabetes and kidney function.  Nicotine dependence, smokeless tobacco Daily use of smokeless tobacco, approximately one can per day.  Alcohol use, current Current alcohol use reported as 8-10 drinks per week.  General Health Maintenance General health maintenance is lacking. - Order comprehensive blood work to check for diabetes, cholesterol levels, blood count, and kidney function.   Aspiration/Injection Procedure Note Hilery Wintle 09/17/1970 Date of procedure: 02/24/2024  Procedure: Large Joint Aspiration / Injection of Knee, R Indications: Pain  Procedure Details Patient verbally consented to procedure. Risks, benefits, and alternatives explained. Sterilely prepped with Chloraprep. Ethyl cholride used for anesthesia. 9 cc Lidocaine  1% mixed with 1 mL of Kenalog  40 mg injected using the anteromedial approach without difficulty. No complications with procedure and tolerated well. Patient had decreased pain post-injection. Medication: 1 mL of Kenalog  40 mg   Medication Management during today's office visit: Meds ordered this encounter  Medications   losartan  (COZAAR ) 25 MG  tablet    Sig: Take 1 tablet (25 mg total) by mouth daily.    Dispense:  30 tablet    Refill:  3   triamcinolone  acetonide (KENALOG -40) injection 40 mg   Medications Discontinued During This Encounter  Medication Reason   cephALEXin  (KEFLEX ) 250 MG capsule Completed Course    Orders placed today for conditions managed today: Orders Placed This Encounter  Procedures   DG Knee 4 Views W/Patella Right   Basic metabolic panel with GFR   CBC with Differential/Platelet   Hepatic function panel   Hemoglobin A1c   Lipid panel   PSA, Total with Reflex to PSA, Free    Disposition: Return for 6-8 weeks complete physical.  [employer] One speech-to-text software was used for transcription in this dictation.  Possible transcriptional errors can occur using Animal nutritionist.   Signed,  Jacques DASEN. Amiree No, MD   Outpatient Encounter Medications as of 02/24/2024  Medication Sig   losartan  (COZAAR ) 25 MG tablet Take 1 tablet (25 mg total) by mouth daily.   ibuprofen (ADVIL) 200 MG tablet Take 200 mg by mouth every 6 (six) hours as needed for mild pain or moderate pain.   [DISCONTINUED] cephALEXin  (KEFLEX ) 250 MG capsule Take 1 capsule (  250 mg total) by mouth 4 (four) times daily.   [EXPIRED] triamcinolone  acetonide (KENALOG -40) injection 40 mg    No facility-administered encounter medications on file as of 02/24/2024.

## 2024-02-26 LAB — PSA, TOTAL WITH REFLEX TO PSA, FREE: PSA, Total: 1.4 ng/mL (ref <=4.0)

## 2024-03-01 ENCOUNTER — Ambulatory Visit: Payer: Self-pay | Admitting: Family Medicine

## 2024-04-15 ENCOUNTER — Ambulatory Visit: Payer: Self-pay | Admitting: Family Medicine

## 2024-04-19 DIAGNOSIS — R7303 Prediabetes: Secondary | ICD-10-CM | POA: Insufficient documentation

## 2024-04-19 DIAGNOSIS — E781 Pure hyperglyceridemia: Secondary | ICD-10-CM | POA: Insufficient documentation

## 2024-04-19 NOTE — Progress Notes (Unsigned)
 Alvin Knebel T. [name], MD, CAQ Sports Medicine [facility] at [facility] [address] [place], [address]  Phone: [phone]  FAX: [phone]  Alvin Craig - 53 y.o. male  MRN [id number]  Date of Birth: 03-28-1971  Date: 04/20/2024  PCP: System, Provider Not In  Referral: No ref. provider found  No chief complaint on file.  Patient Care Team: System, Provider Not In as PCP - General Carver, Carlin POUR, DO as Consulting Physician (Internal Medicine) Subjective:   Alvin Craig is a 53 y.o. pleasant patient who presents with the following:  Discussed the use of AI scribe software for clinical note transcription with the patient, who gave verbal consent to proceed.  He is here for a complete physical.  I last saw him and his new patient office visit with a ongoing right sided knee arthritis with exacerbation and did do an intra-articular injection at that time.  We also check lab work at that time. History of Present Illness     Preventative Health Maintenance Visit:  Health Maintenance Summary Reviewed and updated, unless pt declines services.  Tobacco History Reviewed. Alcohol: No concerns, no excessive use Exercise Habits: Some activity, rec at least 30 mins 5 times a week STD concerns: no risk or activity to increase risk Drug Use: None  Health Maintenance  Topic Date Due   HIV Screening  Never done   Hepatitis C Screening  Never done   Hepatitis B Vaccines 19-59 Average Risk (1 of 3 - 19+ 3-dose series) Never done   Colonoscopy  Never done   Pneumococcal Vaccine: 50+ Years (1 of 1 - PCV) Never done   Zoster Vaccines- Shingrix (1 of 2) Never done   COVID-19 Vaccine (3 - 2025-26 season) 02/15/2024   Influenza Vaccine  09/13/2024 (Originally 01/15/2024)   DTaP/Tdap/Td (2 - Td or Tdap) 07/07/2029   HPV VACCINES  Aged Out   Meningococcal B Vaccine  Aged Out   Immunization History  Administered Date(s) Administered   Influenza,  Seasonal, Injecte, Preservative Fre 07/11/2019   PFIZER(Purple Top)SARS-COV-2 Vaccination 01/05/2020, 01/26/2020   Tdap 07/08/2019   Patient Active Problem List   Diagnosis Date Noted   Essential hypertension 02/24/2024    Priority: Medium    Dips tobacco 02/24/2024    No past medical history on file.  Past Surgical History:  Procedure Laterality Date   LEG SURGERY      Family History  Problem Relation Age of Onset   Healthy Mother    Healthy Father     Social History   Social History Narrative   Married   2 children   Dips 1 tin a day   Roughly 8 drinks a week      Work: [employer] in [place]     Past Medical History, Surgical History, Social History, Family History, Problem List, Medications, and Allergies have been reviewed and updated if relevant.  Review of Systems: Pertinent positives are listed above.  Otherwise, a full 14 point review of systems has been done in full and it is negative except where it is noted positive.  Objective:   There were no vitals taken for this visit. Ideal Body Weight:    Ideal Body Weight:   No results found.    02/24/2024   10:54 AM  Depression screen PHQ 2/9  Decreased Interest 0  Down, Depressed, Hopeless 0  PHQ - 2 Score 0  Altered sleeping 0  Tired, decreased energy 0  Change in  appetite 0  Feeling bad or failure about yourself  0  Trouble concentrating 0  Moving slowly or fidgety/restless 0  Suicidal thoughts 0  PHQ-9 Score 0     GEN: well developed, well nourished, no acute distress Eyes: conjunctiva and lids normal, PERRLA, EOMI ENT: TM clear, nares clear, oral exam WNL Neck: supple, no lymphadenopathy, no thyromegaly, no JVD Pulm: clear to auscultation and percussion, respiratory effort normal CV: regular rate and rhythm, S1-S2, no murmur, rub or gallop, no bruits, peripheral pulses normal and symmetric, no cyanosis, clubbing, edema or varicosities GI: soft, non-tender; no hepatosplenomegaly,  masses; active bowel sounds all quadrants GU: deferred Lymph: no cervical, axillary or inguinal adenopathy MSK: gait normal, muscle tone and strength WNL, no joint swelling, effusions, discoloration, crepitus  SKIN: clear, good turgor, color WNL, no rashes, lesions, or ulcerations Neuro: normal mental status, normal strength, sensation, and motion Psych: alert; oriented to person, place and time, normally interactive and not anxious or depressed in appearance.  All labs reviewed with patient. Results for orders placed or performed in visit on 02/24/24  Basic metabolic panel with GFR   Collection Time: 02/24/24 10:52 AM  Result Value Ref Range   Sodium 141 135 - 145 mEq/L   Potassium 4.4 3.5 - 5.1 mEq/L   Chloride 104 96 - 112 mEq/L   CO2 28 19 - 32 mEq/L   Glucose, Bld 90 70 - 99 mg/dL   BUN 20 6 - 23 mg/dL   Creatinine, Ser 9.08 0.40 - 1.50 mg/dL   GFR 04.37 >39.99 mL/min   Calcium 9.4 8.4 - 10.5 mg/dL  CBC with Differential/Platelet   Collection Time: 02/24/24 10:52 AM  Result Value Ref Range   WBC 5.3 4.0 - 10.5 K/uL   RBC 4.69 4.22 - 5.81 Mil/uL   Hemoglobin 13.7 13.0 - 17.0 g/dL   HCT 59.1 60.9 - 47.9 %   MCV 87.0 78.0 - 100.0 fl   MCHC 33.6 30.0 - 36.0 g/dL   RDW 85.6 88.4 - 84.4 %   Platelets 234.0 150.0 - 400.0 K/uL   Neutrophils Relative % 64.6 43.0 - 77.0 %   Lymphocytes Relative 23.1 12.0 - 46.0 %   Monocytes Relative 8.9 3.0 - 12.0 %   Eosinophils Relative 2.8 0.0 - 5.0 %   Basophils Relative 0.6 0.0 - 3.0 %   Neutro Abs 3.4 1.4 - 7.7 K/uL   Lymphs Abs 1.2 0.7 - 4.0 K/uL   Monocytes Absolute 0.5 0.1 - 1.0 K/uL   Eosinophils Absolute 0.1 0.0 - 0.7 K/uL   Basophils Absolute 0.0 0.0 - 0.1 K/uL  Hepatic function panel   Collection Time: 02/24/24 10:52 AM  Result Value Ref Range   Total Bilirubin 0.6 0.2 - 1.2 mg/dL   Bilirubin, Direct 0.1 0.0 - 0.3 mg/dL   Alkaline Phosphatase 60 39 - 117 U/L   AST 23 0 - 37 U/L   ALT 27 0 - 53 U/L   Total Protein 7.1 6.0 -  8.3 g/dL   Albumin 4.6 3.5 - 5.2 g/dL  Hemoglobin J8r   Collection Time: 02/24/24 10:52 AM  Result Value Ref Range   Hgb A1c MFr Bld 5.9 4.6 - 6.5 %  Lipid panel   Collection Time: 02/24/24 10:52 AM  Result Value Ref Range   Cholesterol 199 0 - 200 mg/dL   Triglycerides 791.9 (H) 0.0 - 149.0 mg/dL   HDL 51.79 >60.99 mg/dL   VLDL 58.3 (H) 0.0 - 59.9 mg/dL   LDL  Cholesterol 109 (H) 0 - 99 mg/dL   Total CHOL/HDL Ratio 4    NonHDL 150.59   PSA, Total with Reflex to PSA, Free   Collection Time: 02/24/24 10:52 AM  Result Value Ref Range   PSA, Total 1.4 < OR = 4.0 ng/mL    Assessment and Plan:     ICD-10-CM   1. Healthcare maintenance  Z00.00      Assessment & Plan   Health Maintenance Exam: The patient's preventative maintenance and recommended screening tests for an annual wellness exam were reviewed in full today. Brought up to date unless services declined.  Counselled on the importance of diet, exercise, and its role in overall health and mortality. The patient's FH and SH was reviewed, including their home life, tobacco status, and drug and alcohol status.  Follow-up in 1 year for physical exam or additional follow-up below.  Disposition: No follow-ups on file.  No orders of the defined types were placed in this encounter.  There are no discontinued medications. No orders of the defined types were placed in this encounter.   Signed,  Jacques DASEN. Neomi Laidler, MD   Allergies as of 04/20/2024   No Known Allergies      Medication List        Accurate as of April 19, 2024 10:31 AM. If you have any questions, ask your nurse or doctor.          ibuprofen 200 MG tablet Commonly known as: ADVIL Take 200 mg by mouth every 6 (six) hours as needed for mild pain or moderate pain.   losartan  25 MG tablet Commonly known as: COZAAR  Take 1 tablet (25 mg total) by mouth daily.

## 2024-04-20 ENCOUNTER — Ambulatory Visit (INDEPENDENT_AMBULATORY_CARE_PROVIDER_SITE_OTHER): Admitting: Family Medicine

## 2024-04-20 ENCOUNTER — Encounter: Payer: Self-pay | Admitting: Family Medicine

## 2024-04-20 VITALS — BP 160/90 | HR 86 | Temp 99.1°F | Ht 68.5 in | Wt 240.8 lb

## 2024-04-20 DIAGNOSIS — Z0000 Encounter for general adult medical examination without abnormal findings: Secondary | ICD-10-CM | POA: Diagnosis not present

## 2024-04-20 DIAGNOSIS — Z23 Encounter for immunization: Secondary | ICD-10-CM | POA: Diagnosis not present

## 2024-04-20 DIAGNOSIS — R7303 Prediabetes: Secondary | ICD-10-CM | POA: Diagnosis not present

## 2024-04-20 DIAGNOSIS — E781 Pure hyperglyceridemia: Secondary | ICD-10-CM

## 2024-04-20 DIAGNOSIS — Z1211 Encounter for screening for malignant neoplasm of colon: Secondary | ICD-10-CM | POA: Diagnosis not present

## 2024-04-20 MED ORDER — LOSARTAN POTASSIUM 50 MG PO TABS: 50.0000 mg | ORAL_TABLET | Freq: Every day | ORAL | 3 refills | Status: AC

## 2024-07-04 ENCOUNTER — Encounter (INDEPENDENT_AMBULATORY_CARE_PROVIDER_SITE_OTHER): Payer: Self-pay | Admitting: *Deleted

## 2024-07-21 ENCOUNTER — Ambulatory Visit: Admitting: Family Medicine

## 2024-07-27 ENCOUNTER — Ambulatory Visit: Admitting: Family Medicine
